# Patient Record
Sex: Female | Born: 1935 | Race: White | Hispanic: No | Marital: Single | Smoking: Never smoker
Health system: Southern US, Community
[De-identification: ages and names within clinical notes are randomized; demographics above are authoritative.]

## PROBLEM LIST (undated history)

## (undated) DIAGNOSIS — C541 Malignant neoplasm of endometrium: Secondary | ICD-10-CM

## (undated) HISTORY — DX: Malignant neoplasm of endometrium: C54.1

## (undated) HISTORY — PX: TOTAL HIP ARTHROPLASTY: SHX124

## (undated) HISTORY — PX: ABDOMINAL HYSTERECTOMY: SUR658

---

## 1997-12-28 ENCOUNTER — Ambulatory Visit (HOSPITAL_COMMUNITY): Admission: RE | Admit: 1997-12-28 | Discharge: 1997-12-28 | Payer: Self-pay | Admitting: Obstetrics and Gynecology

## 1998-03-15 ENCOUNTER — Encounter: Admission: RE | Admit: 1998-03-15 | Discharge: 1998-06-13 | Payer: Self-pay | Admitting: *Deleted

## 1998-08-09 ENCOUNTER — Emergency Department (HOSPITAL_COMMUNITY): Admission: EM | Admit: 1998-08-09 | Discharge: 1998-08-09 | Payer: Self-pay | Admitting: Emergency Medicine

## 1999-02-13 ENCOUNTER — Ambulatory Visit (HOSPITAL_COMMUNITY): Admission: RE | Admit: 1999-02-13 | Discharge: 1999-02-13 | Payer: Self-pay | Admitting: *Deleted

## 1999-02-13 ENCOUNTER — Encounter: Payer: Self-pay | Admitting: *Deleted

## 2000-02-26 ENCOUNTER — Ambulatory Visit (HOSPITAL_COMMUNITY): Admission: RE | Admit: 2000-02-26 | Discharge: 2000-02-26 | Payer: Self-pay | Admitting: Obstetrics and Gynecology

## 2000-02-26 ENCOUNTER — Encounter: Payer: Self-pay | Admitting: Obstetrics and Gynecology

## 2000-09-23 ENCOUNTER — Encounter: Payer: Self-pay | Admitting: *Deleted

## 2000-09-23 ENCOUNTER — Encounter: Admission: RE | Admit: 2000-09-23 | Discharge: 2000-09-23 | Payer: Self-pay | Admitting: *Deleted

## 2001-12-07 ENCOUNTER — Other Ambulatory Visit: Admission: RE | Admit: 2001-12-07 | Discharge: 2001-12-07 | Payer: Self-pay | Admitting: Obstetrics and Gynecology

## 2002-07-22 ENCOUNTER — Encounter: Payer: Self-pay | Admitting: Internal Medicine

## 2002-07-22 ENCOUNTER — Encounter: Admission: RE | Admit: 2002-07-22 | Discharge: 2002-07-22 | Payer: Self-pay | Admitting: Internal Medicine

## 2003-09-16 ENCOUNTER — Encounter: Admission: RE | Admit: 2003-09-16 | Discharge: 2003-09-16 | Payer: Self-pay | Admitting: Internal Medicine

## 2005-01-11 ENCOUNTER — Ambulatory Visit (HOSPITAL_COMMUNITY): Admission: RE | Admit: 2005-01-11 | Discharge: 2005-01-11 | Payer: Self-pay | Admitting: Internal Medicine

## 2006-01-29 ENCOUNTER — Ambulatory Visit (HOSPITAL_COMMUNITY): Admission: RE | Admit: 2006-01-29 | Discharge: 2006-01-29 | Payer: Self-pay | Admitting: Obstetrics and Gynecology

## 2007-02-10 ENCOUNTER — Ambulatory Visit (HOSPITAL_COMMUNITY): Admission: RE | Admit: 2007-02-10 | Discharge: 2007-02-10 | Payer: Self-pay | Admitting: Obstetrics and Gynecology

## 2008-02-16 ENCOUNTER — Ambulatory Visit (HOSPITAL_COMMUNITY): Admission: RE | Admit: 2008-02-16 | Discharge: 2008-02-16 | Payer: Self-pay | Admitting: Obstetrics and Gynecology

## 2009-02-21 ENCOUNTER — Ambulatory Visit (HOSPITAL_COMMUNITY): Admission: RE | Admit: 2009-02-21 | Discharge: 2009-02-21 | Payer: Self-pay | Admitting: Obstetrics and Gynecology

## 2010-03-19 ENCOUNTER — Ambulatory Visit (HOSPITAL_COMMUNITY): Admission: RE | Admit: 2010-03-19 | Discharge: 2010-03-19 | Payer: Self-pay | Admitting: Obstetrics and Gynecology

## 2010-08-13 ENCOUNTER — Encounter: Admission: RE | Admit: 2010-08-13 | Discharge: 2010-08-13 | Payer: Self-pay | Admitting: Internal Medicine

## 2010-11-19 ENCOUNTER — Encounter: Payer: Self-pay | Admitting: Internal Medicine

## 2011-02-28 ENCOUNTER — Other Ambulatory Visit (HOSPITAL_COMMUNITY): Payer: Self-pay | Admitting: Internal Medicine

## 2011-02-28 DIAGNOSIS — Z1231 Encounter for screening mammogram for malignant neoplasm of breast: Secondary | ICD-10-CM

## 2011-03-21 ENCOUNTER — Ambulatory Visit (HOSPITAL_COMMUNITY)
Admission: RE | Admit: 2011-03-21 | Discharge: 2011-03-21 | Disposition: A | Discharge status: Home / Self Care | Payer: 59 | Source: Ambulatory Visit | Attending: Internal Medicine | Admitting: Internal Medicine

## 2011-03-21 DIAGNOSIS — Z1231 Encounter for screening mammogram for malignant neoplasm of breast: Secondary | ICD-10-CM | POA: Insufficient documentation

## 2012-04-15 ENCOUNTER — Other Ambulatory Visit (HOSPITAL_COMMUNITY): Payer: Self-pay | Admitting: Obstetrics and Gynecology

## 2012-04-15 DIAGNOSIS — Z1231 Encounter for screening mammogram for malignant neoplasm of breast: Secondary | ICD-10-CM

## 2012-05-12 ENCOUNTER — Ambulatory Visit (HOSPITAL_COMMUNITY)
Admission: RE | Admit: 2012-05-12 | Discharge: 2012-05-12 | Disposition: A | Discharge status: Home / Self Care | Payer: Medicare Other | Source: Ambulatory Visit | Attending: Obstetrics and Gynecology | Admitting: Obstetrics and Gynecology

## 2012-05-12 DIAGNOSIS — Z1231 Encounter for screening mammogram for malignant neoplasm of breast: Secondary | ICD-10-CM | POA: Insufficient documentation

## 2012-08-04 ENCOUNTER — Other Ambulatory Visit: Payer: Self-pay | Admitting: Internal Medicine

## 2012-08-04 DIAGNOSIS — M949 Disorder of cartilage, unspecified: Secondary | ICD-10-CM

## 2012-08-18 ENCOUNTER — Other Ambulatory Visit: Payer: 59

## 2012-08-25 ENCOUNTER — Ambulatory Visit
Admission: RE | Admit: 2012-08-25 | Discharge: 2012-08-25 | Disposition: A | Discharge status: Home / Self Care | Payer: Medicare Other | Source: Ambulatory Visit | Attending: Internal Medicine | Admitting: Internal Medicine

## 2012-08-25 DIAGNOSIS — M899 Disorder of bone, unspecified: Secondary | ICD-10-CM

## 2013-04-14 ENCOUNTER — Other Ambulatory Visit (HOSPITAL_COMMUNITY): Payer: Self-pay | Admitting: Obstetrics and Gynecology

## 2013-04-14 DIAGNOSIS — Z1231 Encounter for screening mammogram for malignant neoplasm of breast: Secondary | ICD-10-CM

## 2013-05-24 ENCOUNTER — Ambulatory Visit (HOSPITAL_COMMUNITY)
Admission: RE | Admit: 2013-05-24 | Discharge: 2013-05-24 | Disposition: A | Discharge status: Home / Self Care | Payer: Medicare Other | Source: Ambulatory Visit | Attending: Obstetrics and Gynecology | Admitting: Obstetrics and Gynecology

## 2013-05-24 DIAGNOSIS — Z1231 Encounter for screening mammogram for malignant neoplasm of breast: Secondary | ICD-10-CM | POA: Insufficient documentation

## 2014-03-30 ENCOUNTER — Encounter: Payer: Self-pay | Admitting: Internal Medicine

## 2014-04-25 ENCOUNTER — Other Ambulatory Visit (HOSPITAL_COMMUNITY): Payer: Self-pay | Admitting: Obstetrics and Gynecology

## 2014-04-25 ENCOUNTER — Encounter: Payer: Self-pay | Admitting: Internal Medicine

## 2014-04-25 DIAGNOSIS — Z1231 Encounter for screening mammogram for malignant neoplasm of breast: Secondary | ICD-10-CM

## 2014-06-13 ENCOUNTER — Ambulatory Visit (HOSPITAL_COMMUNITY)
Admission: RE | Admit: 2014-06-13 | Discharge: 2014-06-13 | Disposition: A | Discharge status: Home / Self Care | Payer: Medicare Other | Source: Ambulatory Visit | Attending: Obstetrics and Gynecology | Admitting: Obstetrics and Gynecology

## 2014-06-13 DIAGNOSIS — Z1231 Encounter for screening mammogram for malignant neoplasm of breast: Secondary | ICD-10-CM | POA: Insufficient documentation

## 2014-06-15 ENCOUNTER — Ambulatory Visit (AMBULATORY_SURGERY_CENTER): Payer: Self-pay

## 2014-06-15 VITALS — Ht 60.5 in | Wt 138.0 lb

## 2014-06-15 DIAGNOSIS — Z1211 Encounter for screening for malignant neoplasm of colon: Secondary | ICD-10-CM

## 2014-06-15 MED ORDER — MOVIPREP 100 G PO SOLR: 1.0000 | Freq: Once | ORAL | Status: DC

## 2014-06-15 NOTE — Progress Notes (Signed)
General anesthesia makes pt sick; PONV and she has had unusual reactions to meds such as unconsciousness for days  No allergies to eggs or soy No home oxygen No diet/weight loss meds  Has email  Emmi instructions given for colonoscopy

## 2014-06-29 ENCOUNTER — Ambulatory Visit (AMBULATORY_SURGERY_CENTER): Payer: Medicare Other | Admitting: Internal Medicine

## 2014-06-29 ENCOUNTER — Encounter: Payer: Self-pay | Admitting: Internal Medicine

## 2014-06-29 VITALS — BP 127/74 | HR 60 | Temp 97.0°F | Resp 17 | Ht 60.5 in | Wt 138.0 lb

## 2014-06-29 DIAGNOSIS — Z1211 Encounter for screening for malignant neoplasm of colon: Secondary | ICD-10-CM

## 2014-06-29 MED ORDER — SODIUM CHLORIDE 0.9 % IV SOLN: 500.0000 mL | INTRAVENOUS | Status: DC

## 2014-06-29 NOTE — Patient Instructions (Signed)

## 2014-06-29 NOTE — Op Note (Signed)
[facility]  [address]. [place], [address]   COLONOSCOPY PROCEDURE REPORT  PATIENT: Gloria, Petersen  MR#: [id number] BIRTHDATE: 02/20/1935 , 79  yrs. old GENDER: Female ENDOSCOPIST: Lafayette Dragon, MD REFERRED BY:Roy Mellody Drown, M.D. PROCEDURE DATE:  06/29/2014 PROCEDURE:   Colonoscopy, screening First Screening Colonoscopy - Avg.  risk and is 50 yrs.  old or older - No.  Prior Negative Screening - Now for repeat screening. 10 or more years since last screening  History of Adenoma - Now for follow-up colonoscopy & has been > or = to 3 yrs.  N/A  Polyps Removed Today? No.  Recommend repeat exam, <10 yrs? No. ASA CLASS:   Class II INDICATIONS:Average risk patient for colon cancer and prior colonoscopy in August 2005 was a normal exam. MEDICATIONS: MAC sedation, administered by CRNA and propofol (Diprivan) 200mg  IV  DESCRIPTION OF PROCEDURE:   After the risks benefits and alternatives of the procedure were thoroughly explained, informed consent was obtained.  A digital rectal exam revealed no abnormalities of the rectum.   The     endoscope was introduced through the anus and advanced to the cecum, which was identified by both the appendix and ileocecal valve. No adverse events experienced.   The quality of the prep was good, using MoviPrep The instrument was then slowly withdrawn as the colon was fully examined.      COLON FINDINGS: There was moderate diverticulosis noted throughout the entire examined colon with associated angulation, tortuosity and muscular hypertrophy.  Retroflexed views revealed no abnormalities. The time to cecum=6 minutes 08 seconds.  Withdrawal time=6 minutes 58 seconds.  The scope was withdrawn and the procedure completed. COMPLICATIONS: There were no complications.  ENDOSCOPIC IMPRESSION: There was moderate diverticulosis noted throughout the entire examined colon  RECOMMENDATIONS: high fiber diet Metamucil 1 teaspoon  daily No recall he to age   eSigned:  Lafayette Dragon, MD 06/29/2014 10:28 AM   cc:   PATIENT NAME:  Gloria, Petersen MR#: [id number]

## 2014-06-29 NOTE — Progress Notes (Signed)
Procedure ends, to recovery, report given and VSS. 

## 2014-06-30 ENCOUNTER — Telehealth: Payer: Self-pay | Admitting: *Deleted

## 2014-06-30 NOTE — Telephone Encounter (Signed)
  Follow up Call-  Call back number 06/29/2014  Post procedure Call Back phone  # [phone]  Permission to leave phone message Yes     Patient questions:  Do you have a fever, pain , or abdominal swelling? No. Pain Score  0 *  Have you tolerated food without any problems? Yes.    Have you been able to return to your normal activities? Yes.    Do you have any questions about your discharge instructions: Diet   No. Medications  No. Follow up visit  No.  Do you have questions or concerns about your Care? No.  Actions: * If pain score is 4 or above: No action needed, pain <4.

## 2015-02-09 ENCOUNTER — Emergency Department (HOSPITAL_COMMUNITY)
Admission: EM | Admit: 2015-02-09 | Discharge: 2015-02-09 | Disposition: A | Discharge status: Home / Self Care | Payer: Medicare Other | Source: Home / Self Care | Attending: Family Medicine | Admitting: Family Medicine

## 2015-02-09 ENCOUNTER — Encounter (HOSPITAL_COMMUNITY): Payer: Self-pay | Admitting: Emergency Medicine

## 2015-02-09 DIAGNOSIS — S0990XA Unspecified injury of head, initial encounter: Secondary | ICD-10-CM

## 2015-02-09 DIAGNOSIS — R040 Epistaxis: Secondary | ICD-10-CM

## 2015-02-09 DIAGNOSIS — J3489 Other specified disorders of nose and nasal sinuses: Secondary | ICD-10-CM

## 2015-02-09 MED ORDER — MUPIROCIN 2 % EX OINT: 1.0000 "application " | TOPICAL_OINTMENT | Freq: Two times a day (BID) | CUTANEOUS | Status: AC

## 2015-02-09 NOTE — ED Provider Notes (Signed)
CSN: [id number]     Arrival date & time 02/09/15  0258 History   First MD Initiated Contact with Patient 02/09/15 [phone]     Chief Complaint  Patient presents with  . Epistaxis  . Head Injury   (Consider location/radiation/quality/duration/timing/severity/associated sxs/prior Treatment) HPI  Bloody nose: started bleeding around 08:00-08:30. Came here immediately. Held pressure w/ improvement. No longer bleeding. Reports recent nasal irritation due to dust and pollen. Nose with some tenderness leading up to nosebleed today. Denies headaches, fevers, sinus pain or pressure. Intermittent rhinorrhea.  HEasd trauma. Occurred 4 days ago. Patient reports carrying a heavy load to the car and lost balance. On way down pts head hit the arm railing. Denies LOC. Deneis HA. Fuzzy feeling for a couple hours. Later that day pt felt at baseline. Still w/ tender spot on head.    Past Medical History  Diagnosis Date  . Endometrial cancer    Past Surgical History  Procedure Laterality Date  . Abdominal hysterectomy    . Total hip arthroplasty     Family History  Problem Relation Age of Onset  . Colon cancer Neg Hx   . Heart attack Mother   . Heart attack Father   . Heart attack Sister   . Heart attack Brother    History  Substance Use Topics  . Smoking status: Never Smoker   . Smokeless tobacco: Never Used  . Alcohol Use: 0.6 oz/week    1 Glasses of wine per week   OB History    No data available     Review of Systems Per HPI with all other pertinent systems negative.   Allergies  Codeine; Oxycontin; and Sulfur  Home Medications   Prior to Admission medications   Medication Sig Start Date End Date Taking? Authorizing Provider  amlodipine-benazepril (LOTREL) 2.5-10 MG per capsule Take 1 capsule by mouth daily.   Yes Historical Provider, MD  aspirin 81 MG tablet Take 81 mg by mouth daily.   Yes Historical Provider, MD  atenolol (TENORMIN) 50 MG tablet Take 50 mg by mouth daily.   Yes  Historical Provider, MD  atorvastatin (LIPITOR) 10 MG tablet Take 10 mg by mouth daily.   Yes Historical Provider, MD  olmesartan (BENICAR) 40 MG tablet Take 40 mg by mouth daily.   Yes Historical Provider, MD  Omega-3 Fatty Acids (FISH OIL) 1200 MG CAPS Take by mouth.   Yes Historical Provider, MD  psyllium (REGULOID) 0.52 G capsule Take 0.52 g by mouth daily.   Yes Historical Provider, MD  ranitidine (ZANTAC) 150 MG tablet Take 150 mg by mouth 2 (two) times daily.   Yes Historical Provider, MD  mupirocin ointment (BACTROBAN) 2 % Place 1 application into the nose 2 (two) times daily. Treat for 3 days 02/09/15   Waldemar Dickens, MD   BP 148/70 mmHg  Pulse 62  Temp(Src) 98.1 F (36.7 C) (Oral)  Resp 16  SpO2 99% Physical Exam Physical Exam  Constitutional: oriented to person, place, and time. appears well-developed and well-nourished. No distress.  HENT:  Small contusion of the right occipital region. Left anterior one third of the narrow with dried blood present and septum with increasing erythema and minimal skin maceration. Head: Normocephalic and atraumatic.  Eyes: EOMI. PERRL.  Neck: Normal range of motion.  Cardiovascular: RRR, no m/r/g, 2+ distal pulses,  Pulmonary/Chest: Effort normal and breath sounds normal. No respiratory distress.  Abdominal: Soft. Bowel sounds are normal. NonTTP, no distension.  Musculoskeletal: Normal range of  motion. Non ttp, no effusion.  Neurological: alert and oriented to person, place, and time.  Skin: Skin is warm. No rash noted. non diaphoretic.  Psychiatric: normal mood and affect. behavior is normal. Judgment and thought content normal.   ED Course  Procedures (including critical care time) Labs Review Labs Reviewed - No data to display  Imaging Review No results found.   MDM   1. Head trauma, initial encounter   2. Epistaxis   3. Nasal pain    The piercing ointment, nasal saline, and Zyrtec. No further workup needed for the head  following trauma the patient with strict return precautions and will go to the emergency room if develops any signs or symptoms of intracranial process. Patient is now 4 days out from initial injury without symptoms during this period of time.      Waldemar Dickens, MD 02/09/15 [phone]

## 2015-02-09 NOTE — Discharge Instructions (Signed)
There is no apparent significant injury from her fall. If you develop any confusion, severe headache, loss of consciousness presented to the emergency room immediately. Your bloody nose likely came from prolonged nasal irritation from a mild bacterial infection as well as irritation from dust and pollen. Please start Zyrtec. Please use nasal saline multiple times per day. Please use the mupirocin ointment twice daily with a Q-tip in ear nose the next 3 days. Please use a Afrin-soaked gauze if needed to prevent further nosebleeds.

## 2015-02-09 NOTE — ED Notes (Signed)
Pt reports falling backwards and hitting back of head on metal railing  3 days ago.   Pt reports today having an acute on set of her nose bleeding today.   Pt has concerns that it may have come from the fall she had prior.

## 2015-06-21 ENCOUNTER — Other Ambulatory Visit (HOSPITAL_COMMUNITY): Payer: Self-pay | Admitting: Obstetrics and Gynecology

## 2015-06-21 DIAGNOSIS — Z1231 Encounter for screening mammogram for malignant neoplasm of breast: Secondary | ICD-10-CM

## 2015-06-27 ENCOUNTER — Ambulatory Visit (HOSPITAL_COMMUNITY)
Admission: RE | Admit: 2015-06-27 | Discharge: 2015-06-27 | Disposition: A | Discharge status: Home / Self Care | Payer: Medicare Other | Source: Ambulatory Visit | Attending: Obstetrics and Gynecology | Admitting: Obstetrics and Gynecology

## 2015-06-27 DIAGNOSIS — Z1231 Encounter for screening mammogram for malignant neoplasm of breast: Secondary | ICD-10-CM

## 2016-04-11 ENCOUNTER — Other Ambulatory Visit: Payer: Self-pay | Admitting: Internal Medicine

## 2016-04-11 DIAGNOSIS — I159 Secondary hypertension, unspecified: Secondary | ICD-10-CM

## 2016-04-11 DIAGNOSIS — M8580 Other specified disorders of bone density and structure, unspecified site: Secondary | ICD-10-CM

## 2016-04-11 DIAGNOSIS — E1122 Type 2 diabetes mellitus with diabetic chronic kidney disease: Secondary | ICD-10-CM

## 2016-04-11 DIAGNOSIS — R0989 Other specified symptoms and signs involving the circulatory and respiratory systems: Secondary | ICD-10-CM

## 2016-04-11 DIAGNOSIS — Z1231 Encounter for screening mammogram for malignant neoplasm of breast: Secondary | ICD-10-CM

## 2016-04-18 ENCOUNTER — Ambulatory Visit
Admission: RE | Admit: 2016-04-18 | Discharge: 2016-04-18 | Disposition: A | Discharge status: Home / Self Care | Payer: Medicare Other | Source: Ambulatory Visit | Attending: Internal Medicine | Admitting: Internal Medicine

## 2016-04-18 DIAGNOSIS — E1122 Type 2 diabetes mellitus with diabetic chronic kidney disease: Secondary | ICD-10-CM

## 2016-04-18 DIAGNOSIS — I159 Secondary hypertension, unspecified: Secondary | ICD-10-CM

## 2016-04-18 DIAGNOSIS — R0989 Other specified symptoms and signs involving the circulatory and respiratory systems: Secondary | ICD-10-CM

## 2016-06-28 ENCOUNTER — Ambulatory Visit
Admission: RE | Admit: 2016-06-28 | Discharge: 2016-06-28 | Disposition: A | Discharge status: Home / Self Care | Payer: Medicare Other | Source: Ambulatory Visit | Attending: Internal Medicine | Admitting: Internal Medicine

## 2016-06-28 DIAGNOSIS — Z1231 Encounter for screening mammogram for malignant neoplasm of breast: Secondary | ICD-10-CM

## 2016-06-28 DIAGNOSIS — M8580 Other specified disorders of bone density and structure, unspecified site: Secondary | ICD-10-CM

## 2017-06-06 ENCOUNTER — Other Ambulatory Visit: Payer: Self-pay | Admitting: Internal Medicine

## 2017-06-06 DIAGNOSIS — Z1231 Encounter for screening mammogram for malignant neoplasm of breast: Secondary | ICD-10-CM

## 2017-07-02 ENCOUNTER — Ambulatory Visit
Admission: RE | Admit: 2017-07-02 | Discharge: 2017-07-02 | Disposition: A | Discharge status: Home / Self Care | Payer: Medicare Other | Source: Ambulatory Visit | Attending: Internal Medicine | Admitting: Internal Medicine

## 2017-07-02 DIAGNOSIS — Z1231 Encounter for screening mammogram for malignant neoplasm of breast: Secondary | ICD-10-CM

## 2018-06-02 ENCOUNTER — Other Ambulatory Visit: Payer: Self-pay | Admitting: Internal Medicine

## 2018-06-02 DIAGNOSIS — Z1231 Encounter for screening mammogram for malignant neoplasm of breast: Secondary | ICD-10-CM

## 2018-07-15 ENCOUNTER — Ambulatory Visit
Admission: RE | Admit: 2018-07-15 | Discharge: 2018-07-15 | Disposition: A | Discharge status: Home / Self Care | Payer: Medicare Other | Source: Ambulatory Visit | Attending: Internal Medicine | Admitting: Internal Medicine

## 2018-07-15 DIAGNOSIS — Z1231 Encounter for screening mammogram for malignant neoplasm of breast: Secondary | ICD-10-CM

## 2019-06-04 ENCOUNTER — Other Ambulatory Visit: Payer: Self-pay | Admitting: Internal Medicine

## 2019-06-04 DIAGNOSIS — Z1231 Encounter for screening mammogram for malignant neoplasm of breast: Secondary | ICD-10-CM

## 2019-07-16 ENCOUNTER — Other Ambulatory Visit: Payer: Self-pay | Admitting: *Deleted

## 2019-07-16 DIAGNOSIS — Z20822 Contact with and (suspected) exposure to covid-19: Secondary | ICD-10-CM

## 2019-07-17 LAB — NOVEL CORONAVIRUS, NAA: SARS-CoV-2, NAA: NOT DETECTED

## 2019-07-19 ENCOUNTER — Ambulatory Visit
Admission: RE | Admit: 2019-07-19 | Discharge: 2019-07-19 | Disposition: A | Discharge status: Home / Self Care | Payer: Medicare Other | Source: Ambulatory Visit | Attending: Internal Medicine | Admitting: Internal Medicine

## 2019-07-19 ENCOUNTER — Other Ambulatory Visit: Payer: Self-pay

## 2019-07-19 DIAGNOSIS — Z1231 Encounter for screening mammogram for malignant neoplasm of breast: Secondary | ICD-10-CM

## 2019-08-06 ENCOUNTER — Other Ambulatory Visit: Payer: Self-pay

## 2019-08-06 DIAGNOSIS — Z20822 Contact with and (suspected) exposure to covid-19: Secondary | ICD-10-CM

## 2019-08-07 LAB — NOVEL CORONAVIRUS, NAA: SARS-CoV-2, NAA: NOT DETECTED

## 2019-08-20 ENCOUNTER — Other Ambulatory Visit: Payer: Self-pay

## 2019-08-20 DIAGNOSIS — Z20822 Contact with and (suspected) exposure to covid-19: Secondary | ICD-10-CM

## 2019-08-21 LAB — NOVEL CORONAVIRUS, NAA: SARS-CoV-2, NAA: NOT DETECTED

## 2019-09-28 ENCOUNTER — Other Ambulatory Visit: Payer: Self-pay

## 2019-09-28 DIAGNOSIS — Z20822 Contact with and (suspected) exposure to covid-19: Secondary | ICD-10-CM

## 2019-09-30 LAB — NOVEL CORONAVIRUS, NAA: SARS-CoV-2, NAA: NOT DETECTED

## 2019-10-06 ENCOUNTER — Other Ambulatory Visit: Payer: Self-pay

## 2019-10-06 DIAGNOSIS — Z20822 Contact with and (suspected) exposure to covid-19: Secondary | ICD-10-CM

## 2019-10-07 LAB — NOVEL CORONAVIRUS, NAA: SARS-CoV-2, NAA: NOT DETECTED

## 2019-11-15 ENCOUNTER — Ambulatory Visit: Payer: Medicare Other | Attending: Internal Medicine

## 2019-11-15 DIAGNOSIS — Z20822 Contact with and (suspected) exposure to covid-19: Secondary | ICD-10-CM

## 2019-11-16 LAB — NOVEL CORONAVIRUS, NAA: SARS-CoV-2, NAA: NOT DETECTED

## 2019-11-18 ENCOUNTER — Ambulatory Visit: Payer: Medicare Other | Attending: Internal Medicine

## 2019-11-18 DIAGNOSIS — Z23 Encounter for immunization: Secondary | ICD-10-CM | POA: Insufficient documentation

## 2019-11-18 NOTE — Progress Notes (Signed)
   Covid-19 Vaccination Clinic  Name:  Gloria Petersen    MRN: [id number] DOB: 01-May-1935  11/18/2019  [name] was observed post Covid-19 immunization for 15 minutes without incidence. She was provided with Vaccine Information Sheet and instruction to access the V-Safe system.   [name] was instructed to call 911 with any severe reactions post vaccine: Marland Kitchen Difficulty breathing  . Swelling of your face and throat  . A fast heartbeat  . A bad rash all over your body  . Dizziness and weakness    Immunizations Administered    Name Date Dose VIS Date Route   Pfizer COVID-19 Vaccine 11/18/2019  6:05 PM 0.3 mL 10/08/2019 Intramuscular   Manufacturer: [place]   Lot: [id number]   [place]: [id number]

## 2019-12-09 ENCOUNTER — Ambulatory Visit: Payer: Medicare Other | Attending: Internal Medicine

## 2019-12-09 DIAGNOSIS — Z23 Encounter for immunization: Secondary | ICD-10-CM | POA: Insufficient documentation

## 2019-12-09 NOTE — Progress Notes (Signed)
   Covid-19 Vaccination Clinic  Name:  Gloria Petersen    MRN: [id number] DOB: [birth year]  12/09/2019  [name] was observed post Covid-19 immunization for 15 minutes without incidence. She was provided with Vaccine Information Sheet and instruction to access the V-Safe system.   [name] was instructed to call 911 with any severe reactions post vaccine: Marland Kitchen Difficulty breathing  . Swelling of your face and throat  . A fast heartbeat  . A bad rash all over your body  . Dizziness and weakness    Immunizations Administered    Name Date Dose VIS Date Route   Pfizer COVID-19 Vaccine 12/09/2019 10:29 AM 0.3 mL 10/08/2019 Intramuscular   Manufacturer: [place]   Lot: [id number]   [place]: [id number]

## 2020-06-26 ENCOUNTER — Other Ambulatory Visit: Payer: Self-pay | Admitting: Internal Medicine

## 2020-06-26 DIAGNOSIS — Z1231 Encounter for screening mammogram for malignant neoplasm of breast: Secondary | ICD-10-CM

## 2020-07-20 ENCOUNTER — Other Ambulatory Visit: Payer: Self-pay

## 2020-07-20 ENCOUNTER — Ambulatory Visit
Admission: RE | Admit: 2020-07-20 | Discharge: 2020-07-20 | Disposition: A | Discharge status: Home / Self Care | Payer: Medicare Other | Source: Ambulatory Visit | Attending: Internal Medicine | Admitting: Internal Medicine

## 2020-07-20 DIAGNOSIS — Z1231 Encounter for screening mammogram for malignant neoplasm of breast: Secondary | ICD-10-CM

## 2020-08-02 ENCOUNTER — Other Ambulatory Visit: Payer: Self-pay | Admitting: Internal Medicine

## 2020-08-02 DIAGNOSIS — R4189 Other symptoms and signs involving cognitive functions and awareness: Secondary | ICD-10-CM

## 2020-08-20 ENCOUNTER — Ambulatory Visit
Admission: RE | Admit: 2020-08-20 | Discharge: 2020-08-20 | Disposition: A | Discharge status: Home / Self Care | Payer: Medicare Other | Source: Ambulatory Visit | Attending: Internal Medicine | Admitting: Internal Medicine

## 2020-08-20 ENCOUNTER — Other Ambulatory Visit: Payer: Self-pay

## 2020-08-20 DIAGNOSIS — R4189 Other symptoms and signs involving cognitive functions and awareness: Secondary | ICD-10-CM

## 2021-05-29 ENCOUNTER — Other Ambulatory Visit: Payer: Self-pay | Admitting: Internal Medicine

## 2021-05-29 DIAGNOSIS — I10 Essential (primary) hypertension: Secondary | ICD-10-CM

## 2021-06-06 ENCOUNTER — Ambulatory Visit
Admission: RE | Admit: 2021-06-06 | Discharge: 2021-06-06 | Disposition: A | Discharge status: Home / Self Care | Payer: Medicare Other | Source: Ambulatory Visit | Attending: Internal Medicine | Admitting: Internal Medicine

## 2021-06-06 DIAGNOSIS — I10 Essential (primary) hypertension: Secondary | ICD-10-CM

## 2021-06-25 ENCOUNTER — Other Ambulatory Visit: Payer: Self-pay | Admitting: Internal Medicine

## 2021-06-25 DIAGNOSIS — Z1231 Encounter for screening mammogram for malignant neoplasm of breast: Secondary | ICD-10-CM

## 2021-08-01 ENCOUNTER — Ambulatory Visit
Admission: RE | Admit: 2021-08-01 | Discharge: 2021-08-01 | Disposition: A | Discharge status: Home / Self Care | Payer: Medicare Other | Source: Ambulatory Visit | Attending: Internal Medicine | Admitting: Internal Medicine

## 2021-08-01 ENCOUNTER — Other Ambulatory Visit: Payer: Self-pay

## 2021-08-01 DIAGNOSIS — Z1231 Encounter for screening mammogram for malignant neoplasm of breast: Secondary | ICD-10-CM

## 2021-12-19 ENCOUNTER — Ambulatory Visit: Payer: Self-pay

## 2021-12-19 ENCOUNTER — Encounter: Payer: Self-pay | Admitting: Orthopedic Surgery

## 2021-12-19 ENCOUNTER — Ambulatory Visit: Payer: Medicare Other | Admitting: Orthopedic Surgery

## 2021-12-19 ENCOUNTER — Other Ambulatory Visit: Payer: Self-pay

## 2021-12-19 DIAGNOSIS — M25562 Pain in left knee: Secondary | ICD-10-CM

## 2021-12-19 DIAGNOSIS — M25561 Pain in right knee: Secondary | ICD-10-CM | POA: Diagnosis not present

## 2021-12-19 DIAGNOSIS — G8929 Other chronic pain: Secondary | ICD-10-CM | POA: Diagnosis not present

## 2021-12-19 NOTE — Progress Notes (Signed)
Office Visit Note   Patient: Gloria Petersen           Date of Birth: [birth year]           MRN: [id number] Visit Date: 12/19/2021 Requested by: Jilda Panda, MD [address] [place],  [place] [address] PCP: Jilda Panda, MD  Subjective: No chief complaint on file.   HPI: [name] is an 87 year old patient with acute onset left knee pain beginning about 3 to 4 weeks ago.  She has tried topical for 10 days.  Has had multiple injections from another provider.  Reports medial sided pain.  Relatively constant pain which is worse with walking.  Has a history of total hip replacement.  She was able to walk about half mile last week but has had some pain since then.  She likes to do exercise classes also.              ROS: All systems reviewed are negative as they relate to the chief complaint within the history of present illness.  Patient denies  fevers or chills.   Assessment & Plan: Visit Diagnoses:  1. Chronic pain of left knee   2. Chronic pain of right knee     Plan: Impression is left knee pain which looks like it may be either meniscal tear or stress reaction or referred pain from the back.  Been going on for several weeks and is impacting her ability to maintain independent living.  Plan MRI left knee to evaluate medial tibial plateau stress reaction.  Follow-up after that study.  Avoid prolonged ambulation weightbearing until then.  Follow-Up Instructions: Return for after MRI.   Orders:  Orders Placed This Encounter  Procedures   XR KNEE 3 VIEW LEFT   XR KNEE 3 VIEW RIGHT   MR Knee Left w/o contrast   No orders of the defined types were placed in this encounter.     Procedures: No procedures performed   Clinical Data: No additional findings.  Objective: Vital Signs: There were no vitals taken for this visit.  Physical Exam:   Constitutional: Patient appears well-developed HEENT:  Head: Normocephalic Eyes:EOM are normal Neck: Normal range of  motion Cardiovascular: Normal rate Pulmonary/chest: Effort normal Neurologic: Patient is alert Skin: Skin is warm Psychiatric: Patient has normal mood and affect   Ortho Exam: Ortho exam demonstrates full active and passive range of motion of the left knee.  Does have discrete tenderness around the medial tibial plateau.  Collateral cruciate ligaments are stable.  No groin pain on the left with internal or external Tatian of the leg.  No nerve root tension signs.  No effusion in the left knee.  Pedal pulses palpable.  Specialty Comments:  No specialty comments available.  Imaging: XR KNEE 3 VIEW LEFT  Result Date: 12/19/2021 [facility] lateral [facility] radiographs left knee reviewed.  Alignment intact.  No acute fracture.  Minimal medial joint space narrowing with no spurring.  XR KNEE 3 VIEW RIGHT  Result Date: 12/19/2021 [facility] lateral [facility] radiographs right knee reviewed.  Mild medial joint space narrowing is present.  Spurring is noted on the medial side but less so in the lateral patellofemoral compartment.  No acute fracture.  Alignment intact.  Patella height normal relative the distal femur.    PMFS History: There are no problems to display for this patient.  Past Medical History:  Diagnosis Date   Endometrial cancer ([place])     Family History  Problem Relation Age of Onset  Heart attack Mother    Heart attack Father    Heart attack Sister    Heart attack Brother    Colon cancer Neg Hx    Breast cancer Neg Hx     Past Surgical History:  Procedure Laterality Date   ABDOMINAL HYSTERECTOMY     TOTAL HIP ARTHROPLASTY     Social History   Occupational History   Not on file  Tobacco Use   Smoking status: Never   Smokeless tobacco: Never  Substance and Sexual Activity   Alcohol use: Yes    Alcohol/week: 1.0 standard drink    Types: 1 Glasses of wine per week   Drug use: No   Sexual activity: Not on file

## 2021-12-21 ENCOUNTER — Telehealth: Payer: Self-pay | Admitting: Orthopedic Surgery

## 2021-12-21 NOTE — Telephone Encounter (Signed)
Called pt 1X and left a vm for her to call and set an appt with Dr. Marlou Sa after 12/22/21

## 2021-12-22 ENCOUNTER — Ambulatory Visit
Admission: RE | Admit: 2021-12-22 | Discharge: 2021-12-22 | Disposition: A | Discharge status: Home / Self Care | Payer: Medicare Other | Source: Ambulatory Visit | Attending: Orthopedic Surgery | Admitting: Orthopedic Surgery

## 2021-12-22 ENCOUNTER — Other Ambulatory Visit: Payer: Self-pay

## 2021-12-22 DIAGNOSIS — G8929 Other chronic pain: Secondary | ICD-10-CM

## 2021-12-24 ENCOUNTER — Telehealth: Payer: Self-pay

## 2021-12-24 NOTE — Telephone Encounter (Signed)
Patients daughter called and would like a call back regarding her mom and what the next steps are.

## 2021-12-24 NOTE — Telephone Encounter (Signed)
I called and talked to the daughter.  Basically bone bruise and medial meniscal tear.  Recommended nonweightbearing exercises which has already been prescribed by geriatric doctor at [facility].  Also no exercise walking but just getting around in the house that she needs to.  Please follow-up in 6 weeks for clinical recheck.  Told the daughter to have her be on the look out for mechanical symptoms.  Thanks

## 2021-12-25 NOTE — Telephone Encounter (Signed)
Appt scheduled

## 2022-01-05 ENCOUNTER — Other Ambulatory Visit: Payer: Medicare Other

## 2022-01-07 ENCOUNTER — Ambulatory Visit: Payer: Medicare Other | Admitting: Orthopedic Surgery

## 2022-02-07 ENCOUNTER — Ambulatory Visit: Payer: Medicare Other | Admitting: Orthopedic Surgery

## 2022-02-07 ENCOUNTER — Encounter: Payer: Self-pay | Admitting: Orthopedic Surgery

## 2022-02-07 DIAGNOSIS — G8929 Other chronic pain: Secondary | ICD-10-CM | POA: Diagnosis not present

## 2022-02-07 DIAGNOSIS — M25562 Pain in left knee: Secondary | ICD-10-CM | POA: Diagnosis not present

## 2022-02-07 NOTE — Progress Notes (Signed)
? ?Office Visit Note ?  ?Patient: Gloria Petersen           ?Date of Birth: December 28, 1934           ?MRN: [id number] ?Visit Date: 02/07/2022 ?Requested by: Jilda Panda, MD ?[address] ?[place],  [place] [address] ?PCP: Jilda Panda, MD ? ?Subjective: ?Chief Complaint  ?Patient presents with  ? Left Knee - Follow-up  ? ? ?HPI: Gloria Petersen is a 87 y.o. female who presents to the office for MRI review.  Patient states that her knee feels 100% better.  Pain is completely resolved.  She has been able to go back to her exercise classes without difficulty.  She denies any mechanical symptoms or limp.  She is able to walk half a mile without pain.  Her daughter is here today with her as well and endorses that Gloria Petersen has made excellent progress.  Patient denies any continued knee pain, groin pain.  Does not wake with pain at night.  Does not have to take any medications for pain.  She takes a calcium and vitamin D supplement. ? ?             ?ROS: All systems reviewed are negative as they relate to the chief complaint within the history of present illness.  Patient denies fevers or chills. ? ?Assessment & Plan: ?Visit Diagnoses:  ?1. Chronic pain of left knee   ? ? ?Plan: Gloria Petersen is a 87 y.o. female who presents to the office for review of left knee MRI scan.  Left knee MRI demonstrated nondisplaced proximal tibial fracture.  She reduced her activity level as discussed with Dr Marlou Sa and with this reduction, she has had complete resolution of symptoms to the point where she initiated return to normal activities by herself.  She has been doing very well and has no tenderness on exam today.  She walks without antalgia on exam today.  Plan is continue with return to her baseline activities and the exercise classes/walking that she wants to do.  She will follow-up with the office as needed if she has any return of symptoms. ? ?Follow-Up Instructions: No follow-ups on file.  ? ?Orders:  ?No orders of the defined types were  placed in this encounter. ? ?No orders of the defined types were placed in this encounter. ? ? ? ? Procedures: ?No procedures performed ? ? ?Clinical Data: ?No additional findings. ? ?Objective: ?Vital Signs: There were no vitals taken for this visit. ? ?Physical Exam:  ?Constitutional: Patient appears well-developed ?HEENT:  ?Head: Normocephalic ?Eyes:EOM are normal ?Neck: Normal range of motion ?Cardiovascular: Normal rate ?Pulmonary/chest: Effort normal ?Neurologic: Patient is alert ?Skin: Skin is warm ?Psychiatric: Patient has normal mood and affect ? ?Ortho Exam: Ortho exam demonstrates left knee without effusion.  No tenderness over the medial or lateral joint lines.  No tenderness over the proximal tibia.  Able to perform straight leg raise without difficulty.  No calf tenderness.  Negative Homans' sign.  She walks without antalgia.  No buckling of the knee with ambulation.  No pain with hip range of motion. ? ?Specialty Comments:  ?No specialty comments available. ? ?Imaging: ?No results found. ? ? ?PMFS History: ?There are no problems to display for this patient. ? ?Past Medical History:  ?Diagnosis Date  ? Endometrial cancer ([place])   ?  ?Family History  ?Problem Relation Age of Onset  ? Heart attack Mother   ? Heart attack Father   ? Heart  attack Sister   ? Heart attack Brother   ? Colon cancer Neg Hx   ? Breast cancer Neg Hx   ?  ?Past Surgical History:  ?Procedure Laterality Date  ? ABDOMINAL HYSTERECTOMY    ? TOTAL HIP ARTHROPLASTY    ? ?Social History  ? ?Occupational History  ? Not on file  ?Tobacco Use  ? Smoking status: Never  ? Smokeless tobacco: Never  ?Substance and Sexual Activity  ? Alcohol use: Yes  ?  Alcohol/week: 1.0 standard drink  ?  Types: 1 Glasses of wine per week  ? Drug use: No  ? Sexual activity: Not on file  ? ? ? ? ?   ?

## 2022-04-03 IMAGING — MR MR KNEE*L* W/O CM
5 of 7 series · 22 of 40 positions shown · non-contrast
Comparison: Plain films of the knees 12/20/2019.

CLINICAL DATA: Left knee pain since a fall approximately 3 weeks
ago.

EXAM:
MRI OF THE LEFT KNEE WITHOUT CONTRAST
TECHNIQUE: Multiplanar, multisequence MR imaging of the knee was performed. No
intravenous contrast was administered.

[Series 3: T2 fat-sat · axial · 4.0mm · 0.50mm/px · z∈[-60,+55]mm · 5 of 24 slices shown (1 of 2)]
[im 1/24]
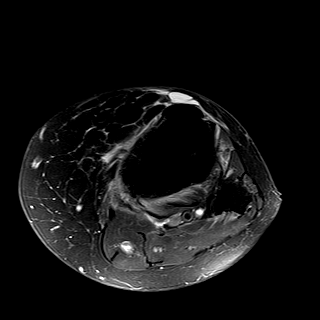
[im 6/24]
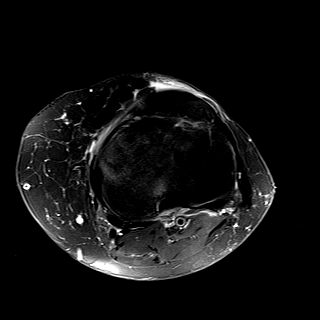
[im 12/24]
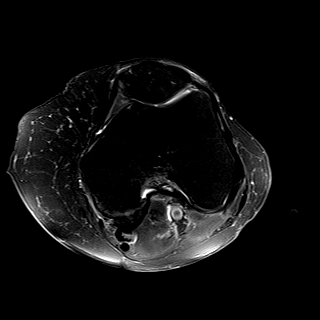
[im 18/24]
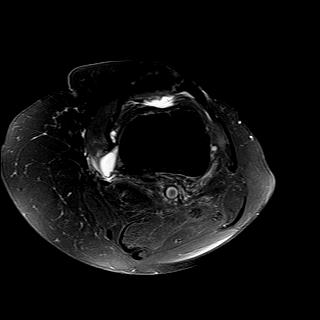
[im 24/24]
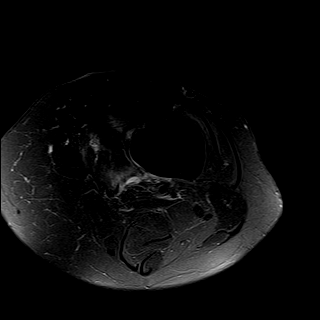

[Series 5: T2 fat-sat · coronal · 4.0mm · 0.29mm/px · 1 of 26 slices shown (2 of 2)]
[im 1/26]
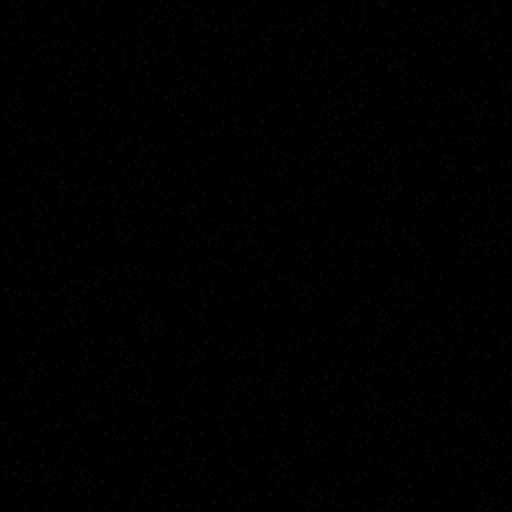

[Series 7: PD fat-sat · sagittal · 3.0mm · 0.29mm/px · 7 of 28 slices shown (1 of 3)]
[im 1/28]
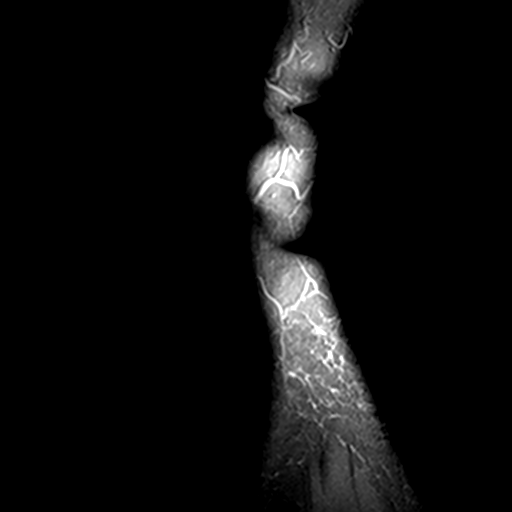
[im 5/28]
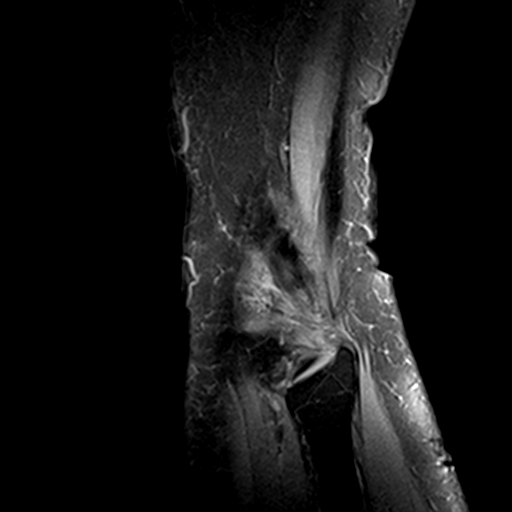
[im 10/28]
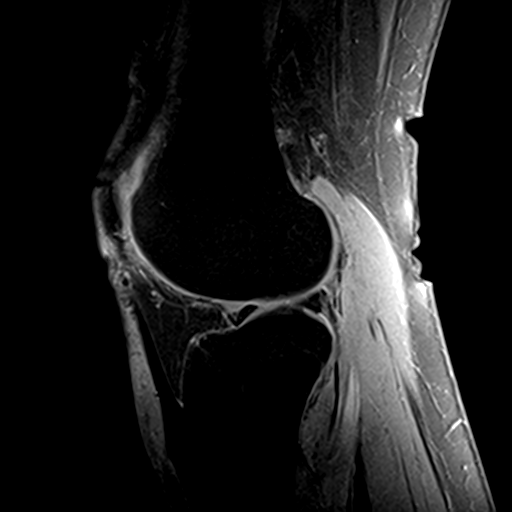
[im 14/28]
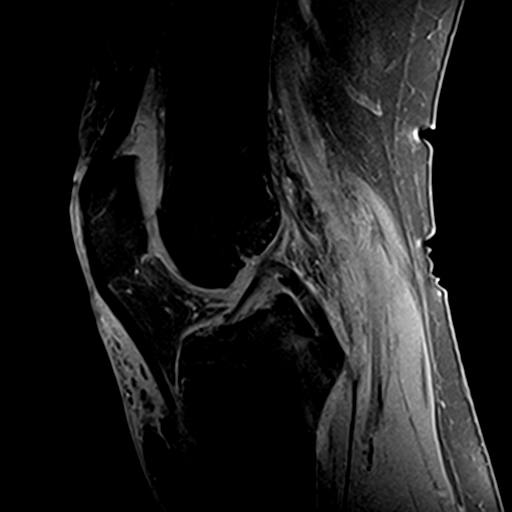
[im 19/28]
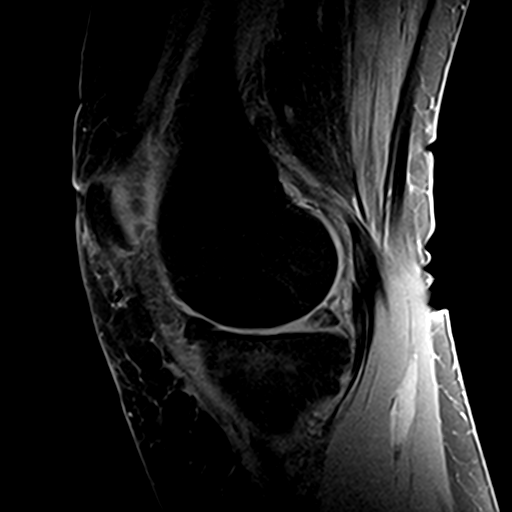
[im 23/28]
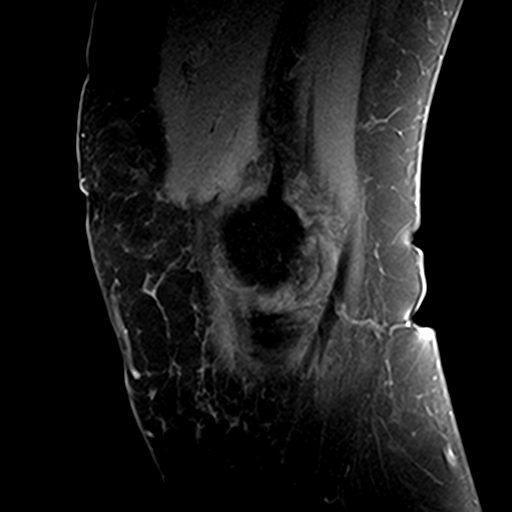
[im 28/28]
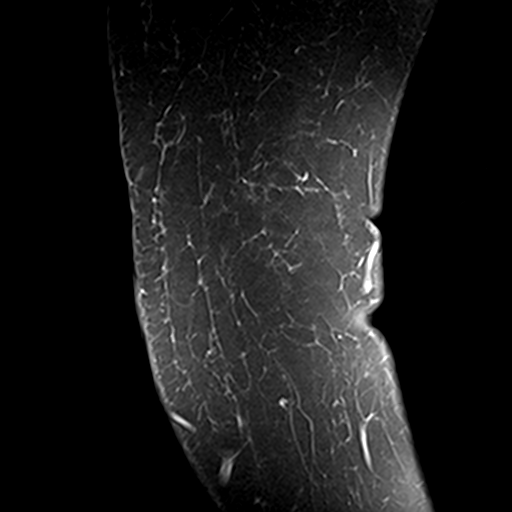

[Series 8: PD fat-sat · coronal · 4.0mm · 0.29mm/px · 6 of 26 slices shown (2 of 3)]
[im 1/26]
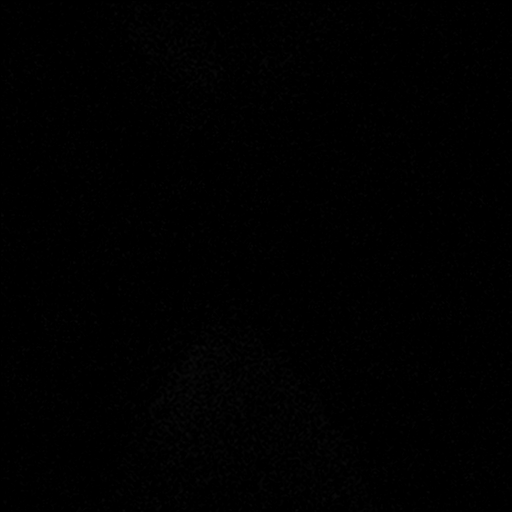
[im 6/26]
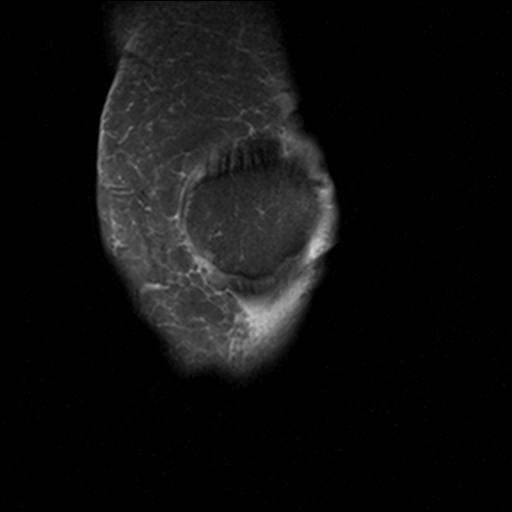
[im 11/26]
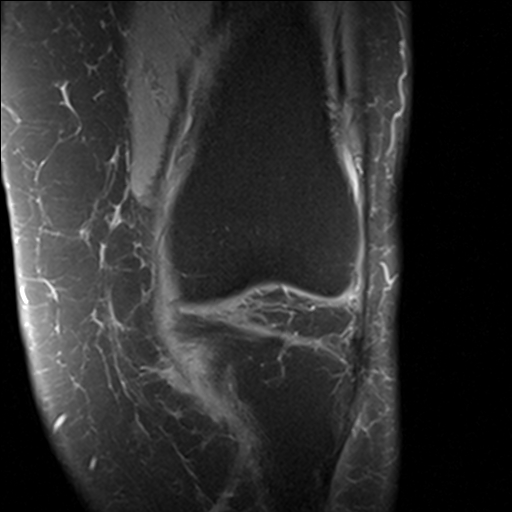
[im 16/26]
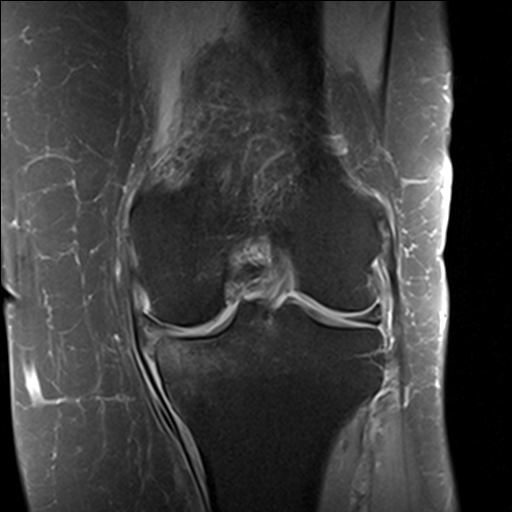
[im 21/26]
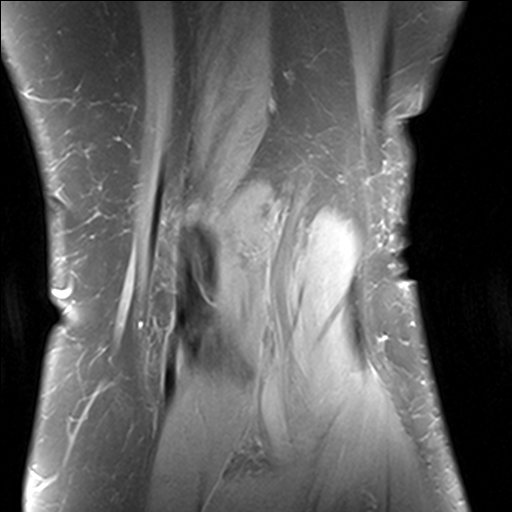
[im 26/26]
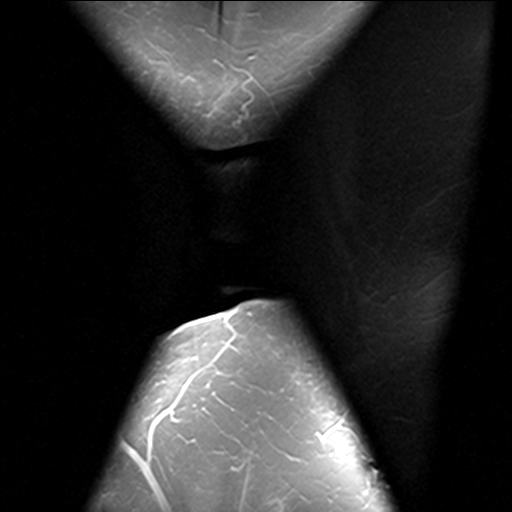

[Series 9: PD fat-sat · coronal · 2.3mm · 0.29mm/px · 3 of 11 slices shown (3 of 3)]
[im 1/11]
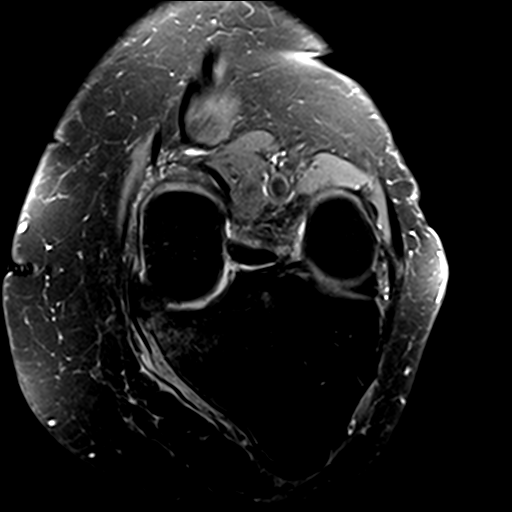
[im 6/11]
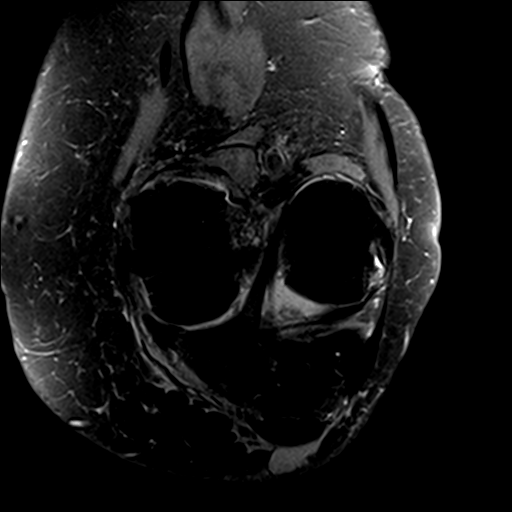
[im 11/11]
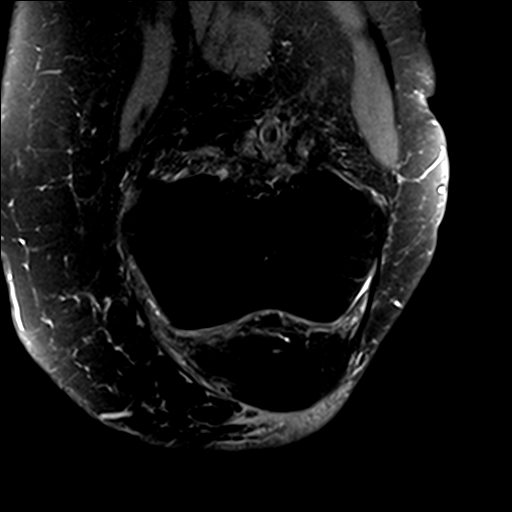

[22 of 40 positions shown; findings below may reference images not displayed]

FINDINGS: MENISCI

Medial meniscus: There is a complex tear in the posterior horn with
a radial component along the free edge and a horizontal component
reaching the meniscal undersurface. The horizontal component extends
into the body.

Lateral meniscus:  Intact.

LIGAMENTS

Cruciates:  Intact.

Collaterals:  Intact.

CARTILAGE

Patellofemoral: Cartilage thinning is worst along the medial femoral
trochlea and medial patellar facet.

Medial:  Thinned and frayed without focal defect.

Lateral:  Mildly degenerated.

Joint:  Small joint effusion.

Popliteal Fossa:  No Baker's cyst.

Extensor Mechanism:  Intact.

Bones: The patient has a nondisplaced, small insufficiency or stress
fracture in the medial tibia with associated marrow edema. Bone
marrow signal is otherwise normal.

Other: None.
IMPRESSION: Complex tear posterior horn medial meniscus extends into the
meniscal body in a horizontal orientation reaching the meniscal
undersurface.

Small, nondisplaced and incomplete acute or subacute fracture medial
tibia.

Mild osteoarthritis appears most notable in the patellofemoral
compartment.

## 2022-04-24 ENCOUNTER — Other Ambulatory Visit: Payer: Self-pay | Admitting: Internal Medicine

## 2022-04-24 DIAGNOSIS — M8580 Other specified disorders of bone density and structure, unspecified site: Secondary | ICD-10-CM

## 2022-04-24 DIAGNOSIS — Z1231 Encounter for screening mammogram for malignant neoplasm of breast: Secondary | ICD-10-CM

## 2022-05-15 ENCOUNTER — Ambulatory Visit
Admission: RE | Admit: 2022-05-15 | Discharge: 2022-05-15 | Disposition: A | Discharge status: Home / Self Care | Payer: Medicare Other | Source: Ambulatory Visit | Attending: Internal Medicine | Admitting: Internal Medicine

## 2022-05-15 DIAGNOSIS — Z1231 Encounter for screening mammogram for malignant neoplasm of breast: Secondary | ICD-10-CM

## 2022-10-16 ENCOUNTER — Other Ambulatory Visit: Payer: Medicare Other

## 2023-04-03 ENCOUNTER — Other Ambulatory Visit: Payer: Medicare Other
